# Patient Record
Sex: Male | Born: 2000 | Race: White | Hispanic: No | Marital: Single | Smoking: Current every day smoker
Health system: Southern US, Community
[De-identification: ages and names within clinical notes are randomized; demographics above are authoritative.]

## PROBLEM LIST (undated history)

## (undated) DIAGNOSIS — J302 Other seasonal allergic rhinitis: Secondary | ICD-10-CM

## (undated) DIAGNOSIS — S060X0A Concussion without loss of consciousness, initial encounter: Secondary | ICD-10-CM

## (undated) HISTORY — DX: Concussion without loss of consciousness, initial encounter: S06.0X0A

## (undated) HISTORY — DX: Other seasonal allergic rhinitis: J30.2

---
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
## 2019-07-05 ENCOUNTER — Other Ambulatory Visit: Payer: Self-pay

## 2019-07-05 DIAGNOSIS — Z79899 Other long term (current) drug therapy: Secondary | ICD-10-CM | POA: Diagnosis not present

## 2019-07-05 DIAGNOSIS — Z046 Encounter for general psychiatric examination, requested by authority: Secondary | ICD-10-CM | POA: Diagnosis present

## 2019-07-05 DIAGNOSIS — Z20822 Contact with and (suspected) exposure to covid-19: Secondary | ICD-10-CM | POA: Insufficient documentation

## 2019-07-05 DIAGNOSIS — F259 Schizoaffective disorder, unspecified: Secondary | ICD-10-CM | POA: Diagnosis not present

## 2019-07-05 DIAGNOSIS — J45909 Unspecified asthma, uncomplicated: Secondary | ICD-10-CM | POA: Diagnosis not present

## 2019-07-05 DIAGNOSIS — Z03818 Encounter for observation for suspected exposure to other biological agents ruled out: Secondary | ICD-10-CM | POA: Diagnosis not present

## 2019-07-05 DIAGNOSIS — F17210 Nicotine dependence, cigarettes, uncomplicated: Secondary | ICD-10-CM | POA: Insufficient documentation

## 2019-07-05 DIAGNOSIS — Z9114 Patient's other noncompliance with medication regimen: Secondary | ICD-10-CM | POA: Insufficient documentation

## 2019-07-06 ENCOUNTER — Encounter (HOSPITAL_COMMUNITY): Payer: Self-pay

## 2019-07-06 ENCOUNTER — Emergency Department (HOSPITAL_COMMUNITY)
Admission: EM | Admit: 2019-07-06 | Discharge: 2019-07-07 | Disposition: A | Discharge status: Other Acute Inpatient Hospital | Payer: 59 | Attending: Emergency Medicine | Admitting: Emergency Medicine

## 2019-07-06 DIAGNOSIS — Z20822 Contact with and (suspected) exposure to covid-19: Secondary | ICD-10-CM | POA: Diagnosis not present

## 2019-07-06 DIAGNOSIS — Z9114 Patient's other noncompliance with medication regimen: Secondary | ICD-10-CM | POA: Diagnosis not present

## 2019-07-06 DIAGNOSIS — F17210 Nicotine dependence, cigarettes, uncomplicated: Secondary | ICD-10-CM | POA: Diagnosis not present

## 2019-07-06 DIAGNOSIS — F259 Schizoaffective disorder, unspecified: Secondary | ICD-10-CM

## 2019-07-06 DIAGNOSIS — J45909 Unspecified asthma, uncomplicated: Secondary | ICD-10-CM | POA: Diagnosis not present

## 2019-07-06 DIAGNOSIS — Z79899 Other long term (current) drug therapy: Secondary | ICD-10-CM | POA: Diagnosis not present

## 2019-07-06 LAB — CBC
HCT: 46.7 % (ref 39.0–52.0)
Hemoglobin: 16 g/dL (ref 13.0–17.0)
MCH: 28.8 pg (ref 26.0–34.0)
MCHC: 34.3 g/dL (ref 30.0–36.0)
MCV: 84.1 fL (ref 80.0–100.0)
Platelets: 303 10*3/uL (ref 150–400)
RBC: 5.55 MIL/uL (ref 4.22–5.81)
RDW: 12.7 % (ref 11.5–15.5)
WBC: 12.6 10*3/uL — ABNORMAL HIGH (ref 4.0–10.5)
nRBC: 0 % (ref 0.0–0.2)

## 2019-07-06 LAB — COMPREHENSIVE METABOLIC PANEL
ALT: 75 U/L — ABNORMAL HIGH (ref 0–44)
AST: 56 U/L — ABNORMAL HIGH (ref 15–41)
Albumin: 4.5 g/dL (ref 3.5–5.0)
Alkaline Phosphatase: 99 U/L (ref 38–126)
Anion gap: 13 (ref 5–15)
BUN: 15 mg/dL (ref 6–20)
CO2: 23 mmol/L (ref 22–32)
Calcium: 9.5 mg/dL (ref 8.9–10.3)
Chloride: 104 mmol/L (ref 98–111)
Creatinine, Ser: 1 mg/dL (ref 0.61–1.24)
GFR calc Af Amer: >60 mL/min (ref >60)
GFR calc non Af Amer: >60 mL/min (ref >60)
Glucose, Bld: 127 mg/dL — ABNORMAL HIGH (ref 70–99)
Potassium: 3.8 mmol/L (ref 3.5–5.1)
Sodium: 140 mmol/L (ref 135–145)
Total Bilirubin: 0.5 mg/dL (ref 0.3–1.2)
Total Protein: 7.7 g/dL (ref 6.5–8.1)

## 2019-07-06 LAB — RAPID URINE DRUG SCREEN, HOSP PERFORMED
Amphetamines: NOT DETECTED
Barbiturates: NOT DETECTED
Benzodiazepines: NOT DETECTED
Cocaine: NOT DETECTED
Opiates: NOT DETECTED
Tetrahydrocannabinol: NOT DETECTED

## 2019-07-06 LAB — SALICYLATE LEVEL: Salicylate Lvl: <7 mg/dL — ABNORMAL LOW (ref 7.0–30.0)

## 2019-07-06 LAB — SARS CORONAVIRUS 2 BY RT PCR (HOSPITAL ORDER, PERFORMED IN ~~LOC~~ HOSPITAL LAB): SARS Coronavirus 2: NEGATIVE

## 2019-07-06 LAB — ACETAMINOPHEN LEVEL: Acetaminophen (Tylenol), Serum: <10 ug/mL — ABNORMAL LOW (ref 10–30)

## 2019-07-06 LAB — ETHANOL: Alcohol, Ethyl (B): <10 mg/dL (ref <10)

## 2019-07-06 MED ORDER — OLANZAPINE 10 MG PO TABS
20.0000 mg | ORAL_TABLET | Freq: Every day | ORAL | Status: DC
  Filled 2019-07-06: qty 2

## 2019-07-06 MED ORDER — OLANZAPINE 10 MG PO TABS
20.0000 mg | ORAL_TABLET | Freq: Every day | ORAL | Status: DC
  Administered 2019-07-06: 20 mg via ORAL

## 2019-07-06 MED ORDER — OLANZAPINE 10 MG PO TABS: 10.0000 mg | ORAL_TABLET | Freq: Every day | ORAL | Status: DC

## 2019-07-06 MED ORDER — TRAZODONE HCL 50 MG PO TABS
50.0000 mg | ORAL_TABLET | Freq: Every evening | ORAL | Status: DC | PRN
  Administered 2019-07-06: 50 mg via ORAL
  Filled 2019-07-06: qty 1

## 2019-07-06 MED ORDER — ALUM & MAG HYDROXIDE-SIMETH 200-200-20 MG/5ML PO SUSP: 30.0000 mL | Freq: Four times a day (QID) | ORAL | Status: DC | PRN

## 2019-07-06 MED ORDER — DIVALPROEX SODIUM 500 MG PO DR TAB
500.0000 mg | DELAYED_RELEASE_TABLET | Freq: Two times a day (BID) | ORAL | Status: DC
  Administered 2019-07-06 (×2): 500 mg via ORAL
  Filled 2019-07-06 (×2): qty 1

## 2019-07-06 MED ORDER — ONDANSETRON HCL 4 MG PO TABS: 4.0000 mg | ORAL_TABLET | Freq: Three times a day (TID) | ORAL | Status: DC | PRN

## 2019-07-06 MED ORDER — NICOTINE 21 MG/24HR TD PT24
21.0000 mg | MEDICATED_PATCH | Freq: Every day | TRANSDERMAL | Status: DC
  Filled 2019-07-06: qty 1

## 2019-07-06 NOTE — ED Notes (Signed)
Pt has been accepted to [facility] main campus and can go there tomorrow.

## 2019-07-06 NOTE — ED Notes (Signed)
Pt's belongings x1 bag is in the cabinet in 23-25 nurses station

## 2019-07-06 NOTE — ED Provider Notes (Signed)
[facility] DEPT Provider Note   CSN: [id number] Arrival date & time: 07/05/19  2350     History Chief Complaint  Patient presents with   Medical Clearance    SHIGEO BAUGH is a 19 y.o. male with a history of schizoaffective disorder, brief psychotic episode, cannabis induced psychotic disorder presenting by police under IVC for medical clearance.  Per IVC paperwork, patient is not taking his home medications.  He went through his parents room and found hidden guns.  He is obsessed with knives and sharp objects.  He is not bathing regularly.  He has become very aggressive and is threatening his parents.  He has vandalized vehicles and is causing damage to items in the home.  The patient states that he is unsure why that he is here.  He has no complaints at this time.  Level 5 caveat secondary to psychosis.  The history is provided by the patient. No language interpreter was used.       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     No family history on file.  Social History   Tobacco Use   Smoking status: Current Every Day Smoker    Packs/day: 1.00   Smokeless tobacco: Never Used  Substance Use Topics   Alcohol use: No   Drug use: Yes    Types: Marijuana     Home Medications Prior to Admission medications   Medication Sig Start Date End Date Taking? Authorizing Provider  calcium carbonate (OS-CAL) 1250 (500 Ca) MG chewable tablet Chew 1 tablet by mouth daily.   Yes [provider]  Multiple Vitamin (MULTIVITAMIN WITH MINERALS) TABS tablet Take 1 tablet by mouth daily.   Yes [provider]  TURMERIC PO Take 1 tablet by mouth daily.   Yes [provider]  ARIPiprazole ER (ABILIFY MAINTENA) 400 MG SRER injection Inject 2 mLs (400 mg total) into the muscle every 28 (twenty-eight) days. (Due on 01-01-19): For mood control Patient not taking: Reported on 07/06/2019 12/10/18   Money, Lowry Ram, FNP  divalproex (DEPAKOTE) 500 MG DR tablet Take 1 tablet (500 mg total) by mouth every 12 (twelve) hours. Patient not taking: Reported on 07/06/2019 12/10/18   Money, Lowry Ram, FNP  OLANZapine (ZYPREXA) 10 MG tablet Take 1 tablet (10 mg total) by mouth daily. Patient not taking: Reported on 07/06/2019 12/11/18   Money, Lowry Ram, FNP  OLANZapine (ZYPREXA) 20 MG tablet Take 1 tablet (20 mg total) by mouth at bedtime. Patient not taking: Reported on 07/06/2019 12/10/18   Money, Lowry Ram, FNP  traZODone (DESYREL) 50 MG tablet Take 1 tablet (50 mg total) by mouth at bedtime as needed for sleep. Patient not taking: Reported on 07/06/2019 12/10/18   Money, Lowry Ram, FNP    Allergies  Guaifenesin  Review of Systems   Review of Systems  Unable to perform ROS: Psychiatric disorder    Physical Exam Updated Vital Signs BP 131/71 (BP Location: Right Arm)    Pulse (!) 109    Temp 98.9 F (37.2 C) (Oral)    Resp 15    Ht 5\' 9"  (1.753 m)    Wt 99.8 kg    SpO2 98%    BMI 32.49 kg/m   Physical Exam Vitals and nursing note reviewed.  Constitutional:      Appearance: He is well-developed.  HENT:     Head: Normocephalic.  Eyes:     Conjunctiva/sclera: Conjunctivae normal.  Cardiovascular:     Rate and Rhythm: Normal rate and regular rhythm.      Heart sounds: No murmur.  Pulmonary:     Effort: Pulmonary effort is normal.  Abdominal:     General: There is no distension.     Palpations: Abdomen is soft.  Musculoskeletal:     Cervical back: Neck supple.  Skin:    General: Skin is warm and dry.  Neurological:     Mental Status: He is alert.  Psychiatric:        Attention and Perception: He is inattentive. He does not perceive auditory or visual hallucinations.        Mood and Affect: Affect is flat and inappropriate.        Speech: Speech is tangential.        Behavior: Behavior normal.     ED Results / Procedures / Treatments   Labs (all labs ordered are listed, but only abnormal results are displayed) Labs Reviewed  COMPREHENSIVE METABOLIC PANEL - Abnormal; Notable for the following components:      Result Value   Glucose, Bld 127 (*)    AST 56 (*)    ALT 75 (*)    All other components within normal limits  SALICYLATE LEVEL - Abnormal; Notable for the following components:   Salicylate Lvl <7.0 (*)    All other components within normal limits  ACETAMINOPHEN LEVEL - Abnormal; Notable for the following components:   Acetaminophen (Tylenol), Serum <10 (*)    All other components within normal limits  CBC - Abnormal; Notable for the following components:   WBC 12.6 (*)    All other components within normal limits  SARS CORONAVIRUS 2 BY RT PCR (HOSPITAL ORDER, PERFORMED IN [facility])  ETHANOL  RAPID URINE DRUG SCREEN, HOSP PERFORMED    EKG None  Radiology No results found.  Procedures Procedures (including critical care time)  Medications Ordered in ED Medications  ondansetron (ZOFRAN) tablet 4 mg (has no administration in time range)  alum & mag hydroxide-simeth (MAALOX/MYLANTA) 200-200-20 MG/5ML suspension 30 mL (has no administration in time range)  nicotine (NICODERM CQ - dosed in mg/24 hours) patch 21 mg (has no administration in time range)  traZODone (DESYREL) tablet 50 mg (50 mg Oral  Given 07/06/19 0420)  divalproex (DEPAKOTE) DR tablet 500 mg (has no administration in time range)  OLANZapine (ZYPREXA) tablet 20 mg (20 mg Oral Given 07/06/19 0420)    ED Course  I have reviewed the triage vital signs and the nursing notes.  Pertinent labs & imaging results that were available during my care of the patient were reviewed by me and considered in my medical decision making (see chart for details).    MDM Rules/Calculators/A&P  19 year old male with a history of schizoaffective disorder, brief psychotic episode, cannabis induced psychotic disorder who presents to the emergency department by police under IVC.  Patient is unsure why he is here.  He has tangential speech during my evaluation.  IVC reports increasing aggressive behavior and not taking his medications.  He is minimally tachycardic.  Minimal elevation in transaminases, which has been noted on previous labs.  He does have a mild leukocytosis, but this is significantly improved from previous.  Likely secondary to substance use.  UDS is pending.  First exam completed by Dr. Nicanor Alcon, attending physician.  Patient is medically cleared at this time.  Pt medically cleared at this time. Psych hold orders and home med orders placed. TTS consult pending; please see psych team notes for further documentation of care/dispo. Pt stable at time of med clearance.    Final Clinical Impression(s) / ED Diagnoses Final diagnoses:  Schizoaffective disorder, unspecified type [facility])    Rx / DC Orders ED Discharge Orders    None       Barkley Boards, PA-C 07/06/19 0706    Palumbo, April, MD 07/06/19 2307

## 2019-07-06 NOTE — ED Notes (Signed)
Patient using phone at this time.

## 2019-07-06 NOTE — BH Assessment (Addendum)
BHH Assessment Progress Note  Per Shuvon Rankin, FNP, this pt requires psychiatric hospitalization at this time.  Pt presents under IVC initiated by pt's father and upheld by EDP April Palumbo, MD.  The following facilities have been contacted to seek placement for this pt, with results as noted:  Beds available or bed status unknown, information sent, decision pending: [facility] Rhae Hamm[facility]  At capacity: [facility] Milana Kidney, [place] Behavioral Health Coordinator [phone]

## 2019-07-06 NOTE — BH Assessment (Signed)
Tele Assessment Note   Patient Name: Gary Lawson MRN: [id number] Referring Physician: EDP Location of Patient: [facility] Location of Provider: Behavioral Health TTS Department  Scherrie Gerlach is an 19 y.o. male who presented to  [facility] under IVC (petitioner is his father, Delon Revelo).  Pt lives in [place] with parents Azucena Kuba and Lupton.  He is currently unemployed, and he is not followed by an outpatient provider.  Pt was last assessed by TTS in October 2020 due to behavioral and mental disturbance (tentative diagnosis of Schizophrenia).    Per IVC, Pt has become increasingly aggressive since January, has sought access to firearms and has found and hidden firearms, and has made statements about wanting to kill someone.  Pt also has engaged in increasingly impulsive and destructive behavior.  Pt was assessed via telehealth.  He had poor eye contact and seemed very drowsy.  Pt stated that he did not know why he was at the hospital.  He denied suicidal ideation, homicidal ideation, hallucination, and self-injurious behavior.  Pt had to be roused several times during sleep.  Pt stated that he wanted to leave the hospital.  Pt has several upcoming court dates for DWI, driving with an expired license, and open container.  Author was not able to speak with Pt's father (IVC petitioner), but Thereasa Parkin was able to gather history from Pt's mother.  Per mother, Pt experienced change in personality and behavior after he returned from a beach trip in November 2019.  Since that time, Pt has become increasingly impulsive and destructive.  Mother suspects onset of Schizophrenia or TBI.  Per mother, Pt has become increasingly belligerent over the last month.  Yesterday, he gave mother a bottle of water with a live tic inside it (he has been collecting tics); this past week, he asked mother if she would hate him if he killed someone; he also told her that he wanted to kill someone; he also found and hid a family  firearm.  Mother also stated that Pt is not sleeping, cuts himself, punches trees, and has responded to internal stimuli -- to her, he has endorsed auditory and olfactory hallucination this week.  Mother expressed fear that Pt will harm others or himself if discharged.  Per mother, Pt has  History of substance and alcohol use disorder, but she is unsure of specifics.  Pt has been treated at [facility] for addiction, and per history, he has used CBD.  During assessment, Pt presented as drowsy and oriented to time and person.  He had poor eye contact and had to be roused.  Pt was in scrubs, and he appeared appropriately groomed.  Pt's mood was empty.  Affect was blunted.  Pt's speech was soft and slow.  Pt's thought processes were within normal range.  Thought content was concrete.  Pt's memory and concentration were poor.  Insight, judgment, and impulse control were poor.  Consulted with Alyse Low, MD and S. Rankin, NP, who determined that Pt meets inpatient criteria.  IVC upheld.  Diagnosis: Schizophrenia, per history  Past Medical History:  Past Medical History:  Diagnosis Date  . Asthma   . Concussion without loss of consciousness 10/21/2011  . Seasonal allergies     No past surgical history on file.  Family History: No family history on file.  Social History:  reports that he has been smoking. He has been smoking about 1.00 pack per day. He has never used smokeless tobacco. He reports current drug use. Drug: Marijuana. He reports  that he does not drink alcohol.  Additional Social History:  Alcohol / Drug Use Pain Medications: See MAR Prescriptions: See MAR Over the Counter: See MAR History of alcohol / drug use?: Yes(per history --cbd use; uds na)  CIWA: CIWA-Ar BP: 131/71 Pulse Rate: (!) 109 COWS:    Allergies:  Allergies  Allergen Reactions  . Guaifenesin     REACTION: hyperactive    Home Medications: (Not in a hospital admission)   OB/GYN Status:  No LMP for male  patient.  General Assessment Data Location of Assessment: [facility] TTS Assessment: In system Is this a Tele or Face-to-Face Assessment?: Tele Assessment Is this an Initial Assessment or a Re-assessment for this encounter?: Initial Assessment Patient Accompanied by:: N/A Language Other than English: No Living Arrangements: Other (Comment)(single family home) What gender do you identify as?: Male Marital status: Single Pregnancy Status: No Living Arrangements: Parent Can pt return to current living arrangement?: Yes Admission Status: Involuntary Petitioner: Family member Is patient capable of signing voluntary admission?: Yes Referral Source: Self/Family/Friend Insurance type: [employer] employee 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 : No Suicidal Intent: No Has patient had any suicidal intent within the past 6 months prior to admission? : No Is patient at risk for suicide?: No Suicidal Plan?: No Has patient had any suicidal plan within the past 6 months prior to admission? : No Access to Means: Yes Specify Access to Suicidal Means: Sharps, firearms What has been your use of drugs/alcohol within the last 12 months?: Pt denied; UDS NA; per history, CBD Previous Attempts/Gestures: Yes How many times?: 1(At least one OD attempt per mother) Other Self Harm Risks: No medication per mother Triggers for Past Attempts: Unknown Intentional Self Injurious Behavior: Cutting Comment - Self Injurious Behavior: Per mother, Pt has cut himself Family Suicide History: Unknown Recent stressful life event(s): Other (Comment)(Not on  medication) Persecutory voices/beliefs?: No Depression: Yes Depression Symptoms: Despondent, Feeling angry/irritable, Isolating Substance abuse history and/or treatment for substance abuse?: Yes(Attended [facility] in Jan-Feb 2021-- alcohol use; unspec drug u) Suicide prevention information given to non-admitted patients: Not applicable  Risk to Others within the past 6 months Homicidal Ideation: Yes-Currently Present Does patient have any lifetime risk of violence toward others beyond the six months prior to admission? : Unknown Thoughts of Harm to Others: Yes-Currently Present Comment - Thoughts of Harm to Others: Per mother, Pt has stated, ''Would you hate me if I killed someone?"'' Current Homicidal Intent: Yes-Currently Present(Hiding firearm under pillow) Current Homicidal Plan: Yes-Currently Present Describe Current Homicidal Plan: Could shoot or stab Access to Homicidal Means: Yes Describe Access to Homicidal Means: Firearm, sharps Identified Victim: Unknown History of harm to others?: No(Unknown) Assessment of Violence: In past 6-12 months Violent Behavior Description: Destroys property, per mother Does patient have access to weapons?: Yes (Comment) Criminal Charges Pending?: Yes Describe Pending Criminal Charges: DWI, Open container Does patient have a court date: Yes Court Date: 07/21/19 Is patient on probation?: Unknown  Psychosis Hallucinations: Olfactory, Auditory(Per mother) Delusions: None noted  Mental Status Report Appearance/Hygiene: In scrubs, Unremarkable Eye Contact: Good Motor Activity: Freedom of movement, Unremarkable Speech: Slow, Soft Level of  Consciousness: Drowsy Mood: Empty Affect: Blunted Anxiety Level: None Judgement: Impaired Orientation: Person, Time, Place Obsessive Compulsive Thoughts/Behaviors: None  Cognitive Functioning Concentration: Poor Memory: Remote Impaired, Recent Impaired Is patient IDD: No Insight: Poor Impulse Control:  Poor Appetite: Good Have you had any weight changes? : No Change Sleep: Decreased Total Hours of Sleep: (Per mother, Pt is up for hours at a time) Vegetative Symptoms: Staying in bed, Not bathing, Decreased grooming  ADLScreening [facility] Assessment Services) Patient's cognitive ability adequate to safely complete daily activities?: Yes Patient able to express need for assistance with ADLs?: Yes Independently performs ADLs?: Yes (appropriate for developmental age)  Prior Inpatient Therapy Prior Inpatient Therapy: Yes Prior Therapy Dates: Late 2020 Prior Therapy Facilty/Provider(s): [facility], [facility] Reason for Treatment: Unspecified addiction/behavioral  Prior Outpatient Therapy Prior Outpatient Therapy: Yes Prior Therapy Dates: January 2021 Prior Therapy Facilty/Provider(s): Dr. Altamese Moraine Reason for Treatment: Unspecified mental disorder Does patient have an ACCT team?: No Does patient have Intensive In-House Services?  : No Does patient have [facility] services? : No Does patient have P4CC services?: No  ADL Screening (condition at time of admission) Patient's cognitive ability adequate to safely complete daily activities?: Yes Is the patient deaf or have difficulty hearing?: No Does the patient have difficulty seeing, even when wearing glasses/contacts?: No Does the patient have difficulty concentrating, remembering, or making decisions?: No Patient able to express need for assistance with ADLs?: Yes Does the patient have difficulty dressing or bathing?: No Independently performs ADLs?: Yes (appropriate for developmental age) Does the patient have difficulty walking or climbing stairs?: No Weakness of Legs: None  Home Assistive Devices/Equipment Home Assistive Devices/Equipment: None  Therapy Consults (therapy consults require a physician order) PT Evaluation Needed: No OT Evalulation Needed: No SLP Evaluation Needed: No Abuse/Neglect Assessment (Assessment to be complete while  patient is alone) Abuse/Neglect Assessment Can Be Completed: Yes Physical Abuse: Denies Verbal Abuse: Denies Sexual Abuse: Denies Exploitation of patient/patient's resources: Denies Self-Neglect: Denies Values / Beliefs Cultural Requests During Hospitalization: None Spiritual Requests During Hospitalization: None Consults Spiritual Care Consult Needed: No Transition of Care Team Consult Needed: No Advance Directives (For Healthcare) Does Patient Have a Medical Advance Directive?: No          Disposition:  Disposition Initial Assessment Completed for this Encounter: Yes Disposition of Patient: Admit(Per Eduard Clos, MD and S. Rankin, NP)  This service was provided via telemedicine using a 2-way, interactive audio and Radiographer, therapeutic.  Names of all persons participating in this telemedicine service and their role in this encounter. Name: Gary Lawson Role: Patient  Name: Gary Lawson Role: Patient's' mother          Marlowe Aschoff 07/06/2019 10:29 AM

## 2019-07-06 NOTE — ED Notes (Signed)
Patient given meal tray.

## 2019-07-06 NOTE — ED Notes (Addendum)
Patient alert eating crackers, cheese, and peanut butter. Ambulatory to restroom. Calm and cooperative at this time.

## 2019-07-06 NOTE — ED Triage Notes (Signed)
Patient arrived with [employer] under IVC papers that state patient has not been taking his medications. Family reports aggression, threatening them, vandalizing property and gathering weapons.

## 2019-07-06 NOTE — ED Notes (Signed)
Patient alert eating dinner tray at this time. Reports he will attempt to void after eating.

## 2019-07-06 NOTE — BHH Counselor (Signed)
Pt accepted to [facility] pending negative COVID test.  Pt may arrive after 10 am on 07/07/19.    Accepting physician is Dr. Estill Cotta.  Please call report to [phone].  Harden Mo, RN.

## 2019-07-06 NOTE — ED Notes (Signed)
Patient resting with eyes closed. Equal chest rise and fall noted. 

## 2019-07-06 NOTE — ED Notes (Signed)
Patient resting quietly with eyes closed. Equal chest rise and fall noted.

## 2019-07-07 DIAGNOSIS — J45909 Unspecified asthma, uncomplicated: Secondary | ICD-10-CM | POA: Diagnosis not present

## 2019-07-07 DIAGNOSIS — Z915 Personal history of self-harm: Secondary | ICD-10-CM | POA: Diagnosis not present

## 2019-07-07 DIAGNOSIS — F17210 Nicotine dependence, cigarettes, uncomplicated: Secondary | ICD-10-CM | POA: Diagnosis not present

## 2019-07-07 DIAGNOSIS — F250 Schizoaffective disorder, bipolar type: Secondary | ICD-10-CM | POA: Diagnosis not present

## 2019-07-07 DIAGNOSIS — Z9114 Patient's other noncompliance with medication regimen: Secondary | ICD-10-CM | POA: Diagnosis not present

## 2019-07-07 NOTE — ED Provider Notes (Signed)
Emergency Medicine Observation Re-evaluation Note  Gary Lawson is a 19 y.o. male, seen on rounds today.  Pt initially presented to the ED for complaints of Medical Clearance and IVC Currently, the patient is resting in bed awaiting transfer to [facility].  Physical Exam  BP 123/60 (BP Location: Right Arm)    Pulse 61    Temp 98.2 F (36.8 C) (Oral)    Resp 15    Ht 5\' 9"  (1.753 m)    Wt 99.8 kg    SpO2 96%    BMI 32.49 kg/m  Physical Exam Vitals and nursing note reviewed.  Constitutional:      General: He is not in acute distress.    Appearance: Normal appearance.  HENT:     Head: Normocephalic.  Pulmonary:     Comments: unlabored Skin:    General: Skin is warm.     ED Course / MDM  EKG:    I have reviewed the labs performed to date as well as medications administered while in observation.  Recent changes in the last 24 hours include placement found at [facility]. Plan  Current plan is for placement. Patient is under full IVC at this time.   [facility], MD 07/07/19 [phone]

## 2019-07-07 NOTE — Progress Notes (Signed)
Patient escorted by security and RN to room 27 in [place]. Wanded by security and belongings placed in locker 27.

## 2019-07-13 DIAGNOSIS — F250 Schizoaffective disorder, bipolar type: Secondary | ICD-10-CM | POA: Diagnosis not present

## 2019-07-31 ENCOUNTER — Encounter (HOSPITAL_COMMUNITY): Payer: Self-pay | Admitting: Emergency Medicine

## 2019-07-31 ENCOUNTER — Other Ambulatory Visit: Payer: Self-pay

## 2019-07-31 ENCOUNTER — Emergency Department (HOSPITAL_COMMUNITY)
Admission: EM | Admit: 2019-07-31 | Discharge: 2019-07-31 | Disposition: A | Discharge status: Home / Self Care | Payer: 59 | Attending: Emergency Medicine | Admitting: Emergency Medicine

## 2019-07-31 DIAGNOSIS — Y998 Other external cause status: Secondary | ICD-10-CM | POA: Insufficient documentation

## 2019-07-31 DIAGNOSIS — Y9289 Other specified places as the place of occurrence of the external cause: Secondary | ICD-10-CM | POA: Diagnosis not present

## 2019-07-31 DIAGNOSIS — S060X0A Concussion without loss of consciousness, initial encounter: Secondary | ICD-10-CM | POA: Insufficient documentation

## 2019-07-31 DIAGNOSIS — Y9389 Activity, other specified: Secondary | ICD-10-CM | POA: Insufficient documentation

## 2019-07-31 DIAGNOSIS — W1830XA Fall on same level, unspecified, initial encounter: Secondary | ICD-10-CM | POA: Diagnosis not present

## 2019-07-31 NOTE — ED Triage Notes (Signed)
Patient complaining of falling and hitting his head. Patient states that he has concussion. Patient state he went somewhere and sat down and slid  Into the floor .

## 2019-09-01 ENCOUNTER — Telehealth (HOSPITAL_COMMUNITY): Payer: Self-pay | Admitting: Psychiatry

## 2019-09-01 ENCOUNTER — Telehealth (HOSPITAL_COMMUNITY): Payer: Self-pay | Admitting: *Deleted

## 2019-09-01 ENCOUNTER — Encounter (HOSPITAL_COMMUNITY): Payer: Self-pay

## 2019-09-01 ENCOUNTER — Ambulatory Visit (HOSPITAL_COMMUNITY): Payer: 59 | Admitting: Psychiatry

## 2019-09-01 ENCOUNTER — Ambulatory Visit (HOSPITAL_COMMUNITY)
Admission: EM | Admit: 2019-09-01 | Discharge: 2019-09-01 | Disposition: A | Discharge status: Home / Self Care | Payer: 59

## 2019-09-01 ENCOUNTER — Other Ambulatory Visit: Payer: Self-pay

## 2019-09-01 DIAGNOSIS — F259 Schizoaffective disorder, unspecified: Secondary | ICD-10-CM | POA: Diagnosis not present

## 2019-09-01 NOTE — Telephone Encounter (Signed)
Patient presented as a walk-in in the morning however when the writer went out to get him he had already left the building.  The staff tried to contact him to return back however his phone did not seem to be working.   Therefore, Patient was not seen today by the writer. Staff was able to confirm that he was prescribed Abilify 15 mg daily and trazodone 50 mg at bedtime from [facility] in the recent past.

## 2019-09-01 NOTE — BH Assessment (Addendum)
Comprehensive Clinical Assessment (CCA) Screening, Triage and Referral Note  09/01/2019 Gary Lawson [id number]   Patient presenting as walk-in to [facility] requesting anxiety medication. Patient reported the police brought him in after they saw him walking around outside. Patient reported he was kicked out of the home a "few hours ago". Patient reported he is missing his anxiety medications and has not taken in over a month. Patient reported history of bipolar. Patient reported occasional alcohol usage, unable to remember last usage. Patient reported 8-9 hours nightly sleep and good appetite. Patient denied drug usage. Patient denied prior suicide attempts and current self harming behaviors. Patient denied SI, HI and psychosis. During assessment patient was cooperative.   Patient reported receiving medication management but unable to remember who is his provider.   Collateral contact, Gary Lawson mother, [phone] Patient gave consent to contact his mother for additional information. Clinician attempted, unable to contact at this time.   Note: Prior to assessment patient took shirt off and started pacing up and down hallways, patient was redirected to put shirt back on and to remain in room.  Disposition Gary Rakers, NP, patient psych cleared. Patient will be seen by [facility] outpatient in the AM.   Visit Diagnosis: Generalized Anxiety Disorder  Patient Reported Information How did you hear about [place]? Self   Referral name: No data recorded  Referral phone number: No data recorded Whom do you see for routine medical problems? Other (Comment) (unable to assess)   Practice/Facility Name: No data recorded  Practice/Facility Phone Number: No data recorded  Name of Contact: No data recorded  Contact Number: No data recorded  Contact Fax Number: No data recorded  Prescriber Name: No data recorded  Prescriber Address (if known): No data recorded What Is the Reason for Your Visit/Call  Today? No data recorded How Long Has This Been Causing You Problems? <Week  Have You Recently Been in Any Inpatient Treatment (Hospital/Detox/Crisis Center/28-Day Program)? No   Name/Location of Program/Hospital:No data recorded  How Long Were You There? No data recorded  When Were You Discharged? No data recorded Have You Ever Received Services From [facility] Before? No   Who Do You See at [facility]? No data recorded Have You Recently Had Any Thoughts About Hurting Yourself? No   Are You Planning to Commit Suicide/Harm Yourself At This time?  No  Have you Recently Had Thoughts About Hurting Someone Gary Lawson? No   Explanation: No data recorded Have You Used Any Alcohol or Drugs in the Past 24 Hours? No   How Long Ago Did You Use Drugs or Alcohol?  No data recorded  What Did You Use and How Much? No data recorded What Do You Feel Would Help You the Most Today? Medication  Do You Currently Have a Therapist/Psychiatrist? No   Name of Therapist/Psychiatrist: No data recorded  Have You Been Recently Discharged From Any Office Practice or Programs? No   Explanation of Discharge From Practice/Program:  No data recorded    CCA Screening Triage Referral Assessment Type of Contact: Tele-Assessment   Is this Initial or Reassessment? Initial Assessment   Date Telepsych consult ordered in [facility]:  No data recorded  Time Telepsych consult ordered in [facility]:  No data recorded Patient Reported Information Reviewed? Yes   Patient Left Without Being Seen? No data recorded  Reason for Not Completing Assessment: No data recorded Collateral Involvement: Gary Lawson, mother [phone]  Does Patient Have a Court Appointed Legal Guardian? No data recorded  Name and  Contact of Legal Guardian:  Self.   If Minor and Not Living with Parent(s), Who has Custody? No data recorded Is CPS involved or ever been involved? Never  Is APS involved or ever been involved? Never  Patient Determined To  Be At Risk for Harm To Self or Others Based on Review of Patient Reported Information or Presenting Complaint? No data recorded  Method: No data recorded  Availability of Means: No data recorded  Intent: No data recorded  Notification Required: No data recorded  Additional Information for Danger to Others Potential:  No data recorded  Additional Comments for Danger to Others Potential:  No data recorded  Are There Guns or Other Weapons in Your Home?  No    Types of Guns/Weapons: No data recorded   Are These Weapons Safely Secured?                              No data recorded   Who Could Verify You Are Able To Have These Secured:    No data recorded Do You Have any Outstanding Charges, Pending Court Dates, Parole/Probation? No data recorded Contacted To Inform of Risk of Harm To Self or Others: No data recorded Location of Assessment: [facility] Assessment Services  Does Patient Present under Involuntary Commitment? No   IVC Papers Initial File Date: No data recorded  [place] of Residence: [place]  Patient Currently Receiving the Following Services: Medication Management   Determination of Need: Routine (7 days)   Options For Referral: Medication Management   Gary Lawson, [facility]

## 2019-09-01 NOTE — ED Provider Notes (Signed)
Behavioral Health Medical Screening Exam  Gary Lawson is a 19 y.o. 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 Pt reports the police picked him up while he was walking around. Pt reports he is having issues and was kicked out by his mother today. Pt states "I need some help, I'm feeling a lot of anxiety". Pt states he needs medication refill and has not had any medication for a month. Pt was unable to remember the medication he takes. Pt denies SI, HI, SH, AVH, paranoia and prior SA. Pt states he sleeps 8-9 hrs a day and has a good appetite.   Total Time spent with patient: 30 minutes  Psychiatric Specialty Exam  Presentation  General Appearance:Appropriate for Environment  Eye Contact:Minimal  Speech:Normal Rate  Speech Volume:Normal  Handedness:Right   Mood and Affect  Mood:Anxious  Affect:Congruent   Thought Process  Thought Processes:Coherent  Descriptions of Associations:Intact  Orientation:Full (Time, Place and Person)  Thought Content:Logical  Hallucinations:None  Ideas of Reference:None  Suicidal Thoughts:No  Homicidal Thoughts:No   Sensorium  Memory:Immediate Good  Judgment:Fair  Insight:Fair   Executive Functions  Concentration:Fair  Attention Span:Fair  Recall:Fair  Fund of Knowledge:Good  Language:Good   Psychomotor Activity  Psychomotor Activity:Normal   Assets  Assets:Communication Skills;Physical Health;Leisure Time   Sleep  Sleep:Good  Number of hours: 9   Physical Exam: Physical Exam ROS Blood pressure (!) 135/82, pulse (!) 108, temperature 98.3 F (36.8 C), resp. rate 18, SpO2 97 %. There is no height or weight on file to calculate BMI.  Musculoskeletal: Strength & Muscle Tone: within normal limits Gait & Station: normal Patient leans: N/A   Recommendations:  Based on my evaluation the patient does  not appear to have an emergency medical condition.   Disposition: No evidence of imminent risk to self or others at present.   Patient does not meet criteria for psychiatric inpatient admission. Supportive therapy provided about ongoing stressors. Discussed crisis plan, support from social network, calling 911, coming to the Emergency Department, and calling Suicide Hotline.  Wandra Arthurs, NP 09/01/2019, 3:29 AM

## 2019-09-01 NOTE — Telephone Encounter (Signed)
PATIENT LEFT THE BUILDING BEFORE HE WAS SEEN BY PROVIDER. DID CONTACT Rx TO SEE WHAT MED's/SCRIPTS GIVEN FROM [facility]

## 2019-09-01 NOTE — Discharge Instructions (Signed)
Pt to follow up with scheduled appointment
